# Patient Record
Sex: Female | Born: 1975 | Hispanic: Yes | Marital: Married | Smoking: Never smoker
Health system: Southern US, Community
[De-identification: ages and names within clinical notes are randomized; demographics above are authoritative.]

## PROBLEM LIST (undated history)

## (undated) HISTORY — PX: ABDOMINAL HYSTERECTOMY: SHX81

---

## 2004-12-18 ENCOUNTER — Ambulatory Visit: Payer: Self-pay | Admitting: Family Medicine

## 2006-05-29 ENCOUNTER — Encounter: Payer: Self-pay | Admitting: Family Medicine

## 2006-06-10 ENCOUNTER — Encounter: Payer: Self-pay | Admitting: Family Medicine

## 2007-05-16 ENCOUNTER — Ambulatory Visit: Payer: Self-pay | Admitting: Family Medicine

## 2007-05-28 ENCOUNTER — Ambulatory Visit: Payer: Self-pay | Admitting: Orthopaedic Surgery

## 2008-08-18 ENCOUNTER — Ambulatory Visit: Payer: Self-pay | Admitting: Family Medicine

## 2008-09-17 ENCOUNTER — Ambulatory Visit: Payer: Self-pay | Admitting: Family Medicine

## 2008-12-30 ENCOUNTER — Ambulatory Visit: Payer: Self-pay | Admitting: Family Medicine

## 2009-05-26 ENCOUNTER — Ambulatory Visit: Payer: Self-pay | Admitting: Family Medicine

## 2009-11-08 ENCOUNTER — Ambulatory Visit: Payer: Self-pay | Admitting: Family Medicine

## 2010-12-01 IMAGING — US US PELV - US TRANSVAGINAL
1 series · 14 of 25 positions shown · non-contrast
Comparison: none

REASON FOR EXAM: Pelvic pain
COMMENTS:

[Series 1: us pelv - us transvaginal · 0.43mm/px · 14 of 66 slices shown]
[im 1/66]
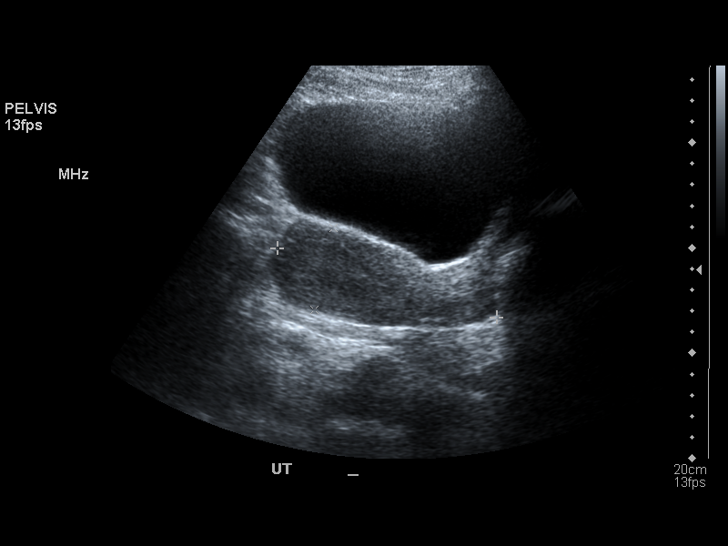
[im 6/66]
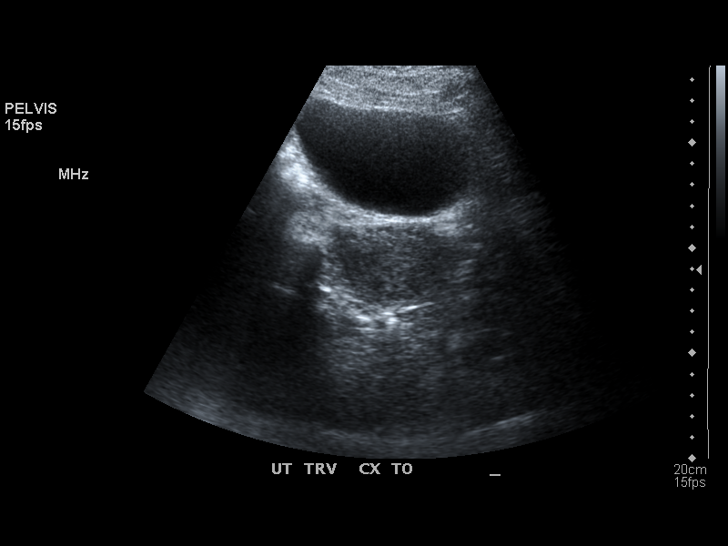
[im 11/66]
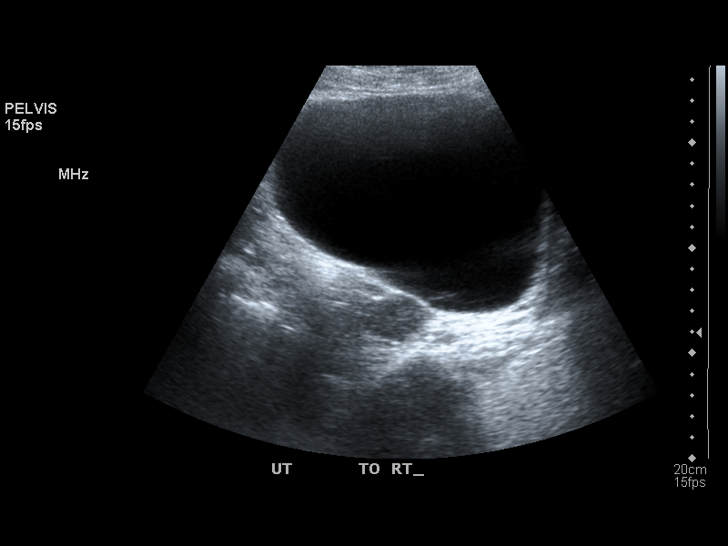
[im 17/66]
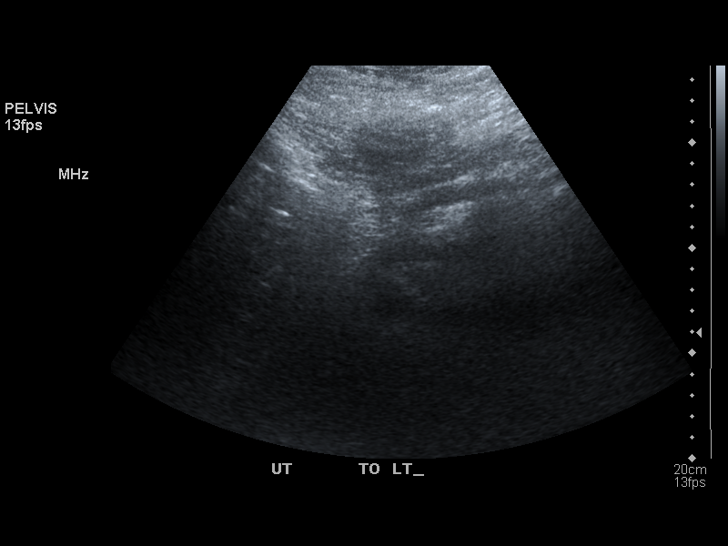
[im 22/66]
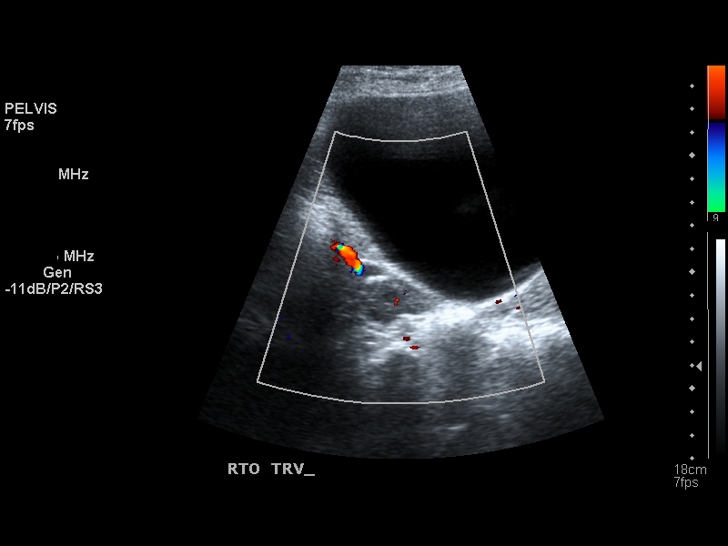
[im 25/66]
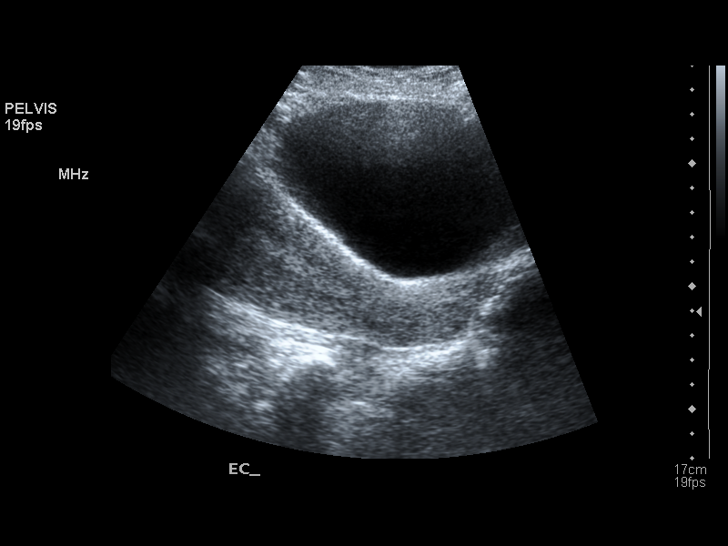
[im 30/66]
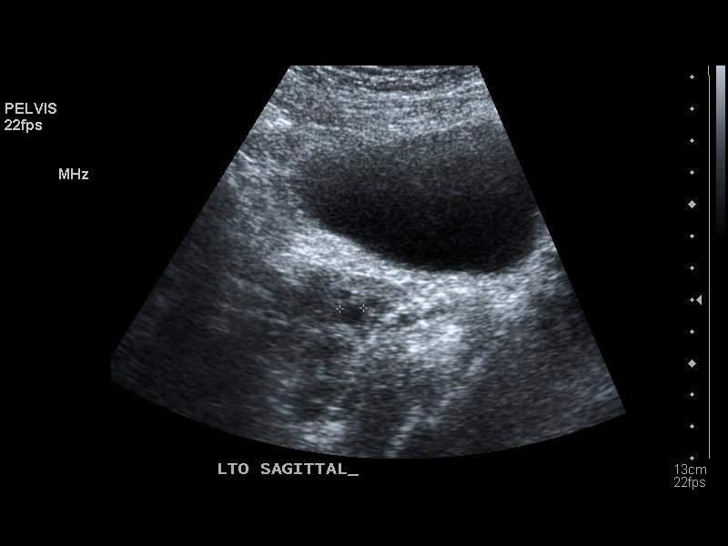
[im 36/66]
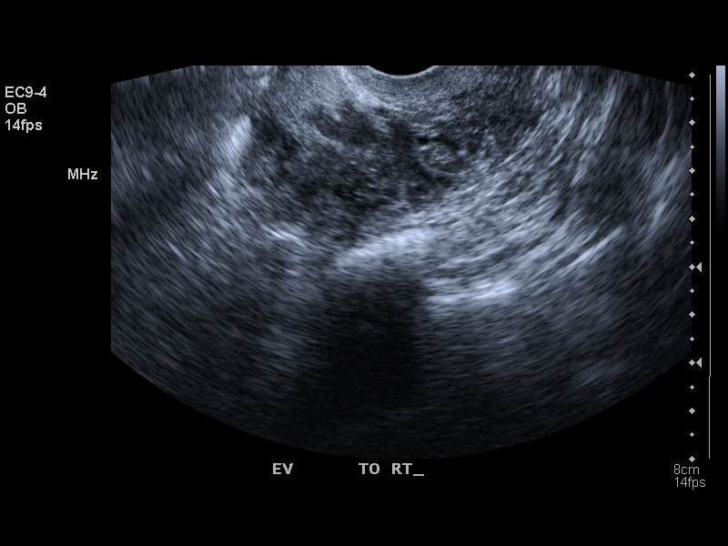
[im 41/66]
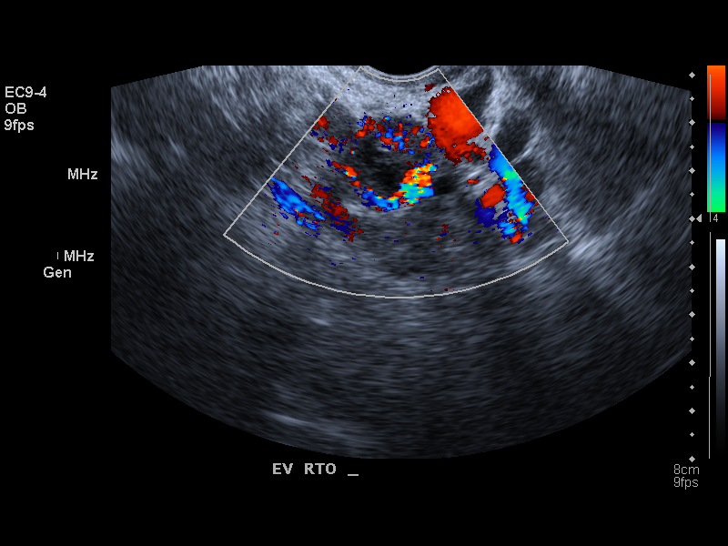
[im 44/66]
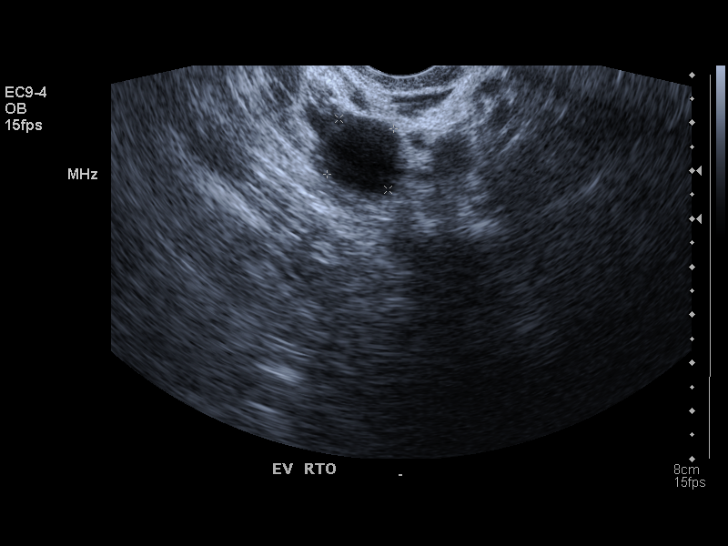
[im 49/66]
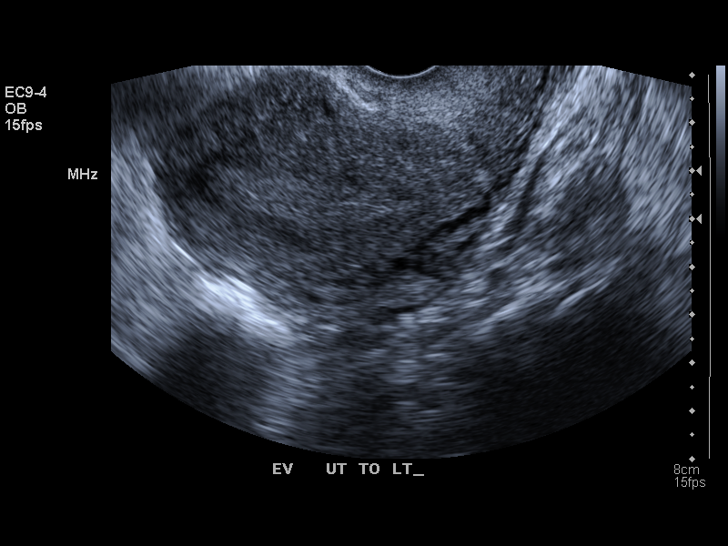
[im 55/66]
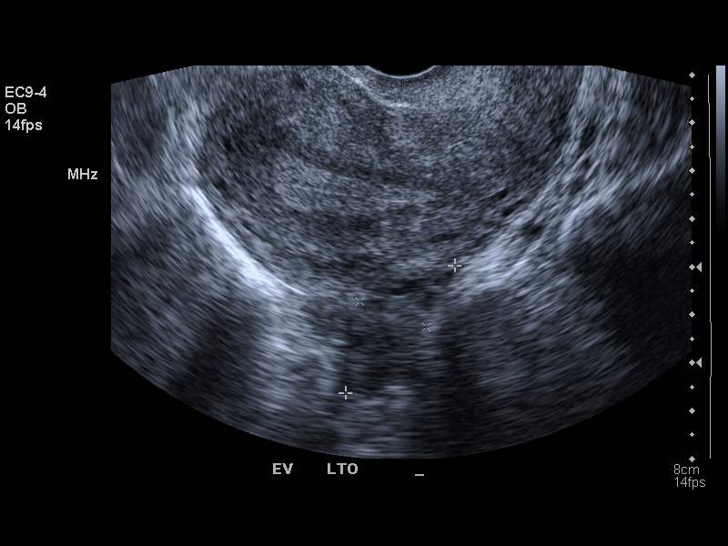
[im 60/66]
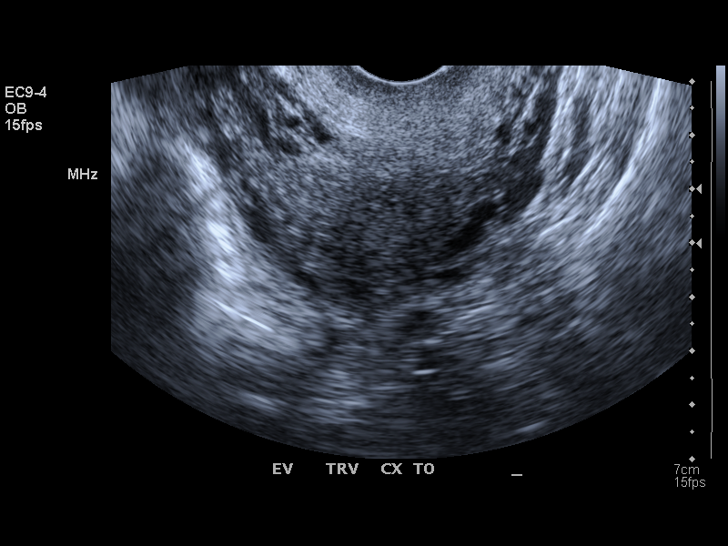
[im 66/66]
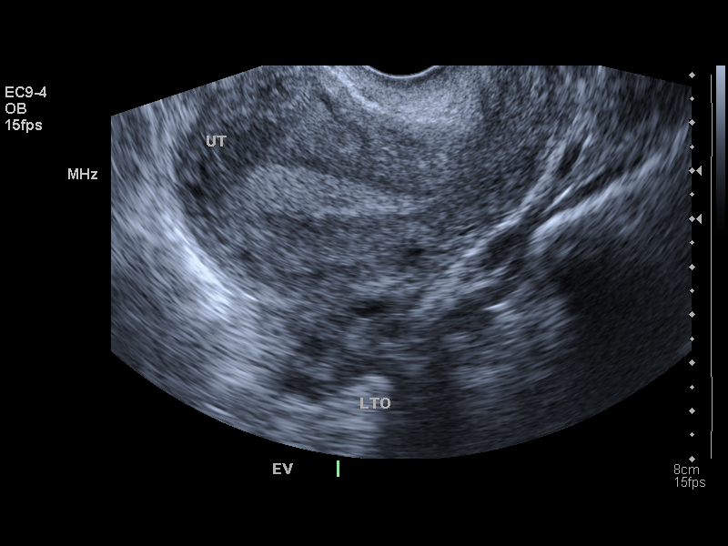

[14 of 25 positions shown; findings below may reference images not displayed]

PROCEDURE:     US  - US PELVIS MASS EXAM W/TRANSVAGI  - August 18, 2008  [DATE]

RESULT:     Transabdominal and endovaginal ultrasound was performed.

The uterus measures 10.9 cm x 4.0 cm x 6.1 cm. The endometrium measures
mm in thickness. No uterine mass lesions are seen. The RIGHT and LEFT
ovaries are visualized. The RIGHT ovary measures 4.09 cm at maximum
diameter. The LEFT ovary measures 3.49 cm at maximum diameter. There is a
1.68 cm cyst of the RIGHT ovary. No free fluid is seen in the pelvis.
IMPRESSION: There is a 1.68 cm simple cyst of the RIGHT ovary.
Otherwise, normal study.

## 2010-12-31 IMAGING — US TRANSABDOMINAL ULTRASOUND OF PELVIS
1 series · 17 of 25 positions shown · non-contrast
Comparison: none

REASON FOR EXAM: Follow-up small ovarian cyst
COMMENTS:

[Series 1: transabdominal ultrasound of pelvis · 17 of 26 slices shown]
[im 1/26]
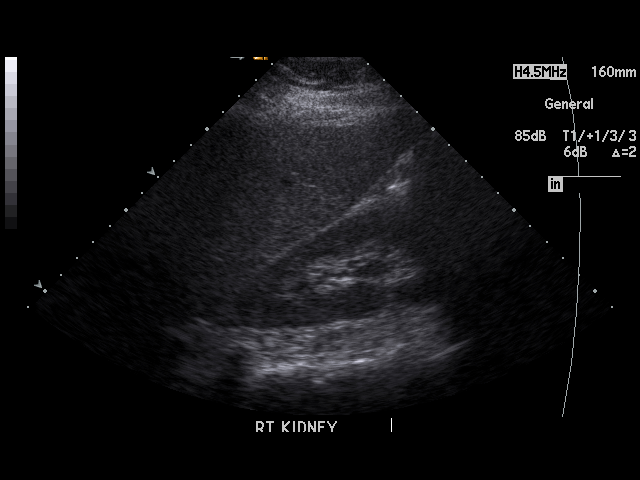
[im 3/26]
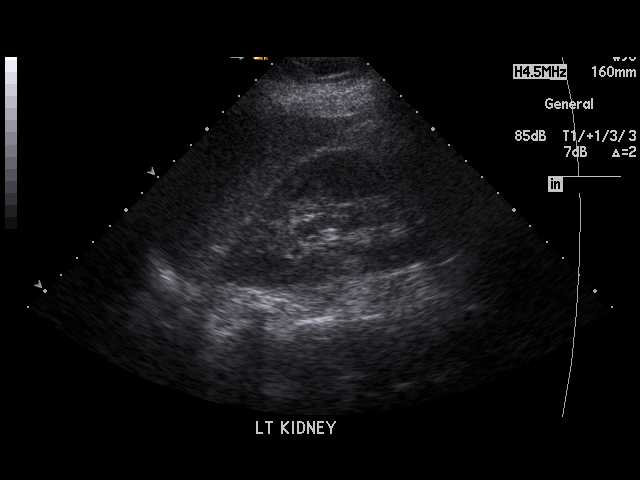
[im 4/26]
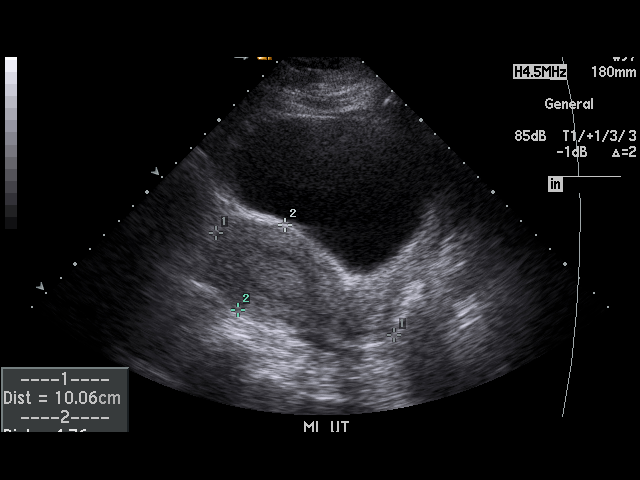
[im 6/26]
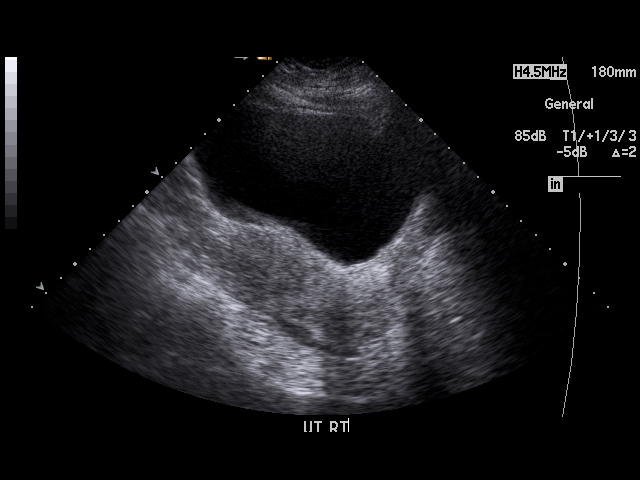
[im 7/26]
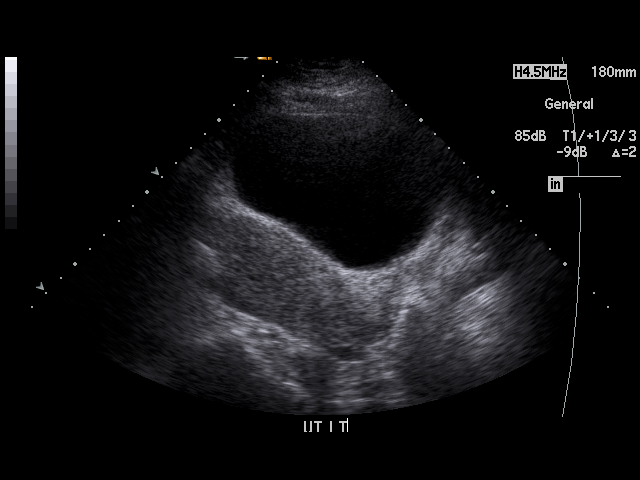
[im 9/26]
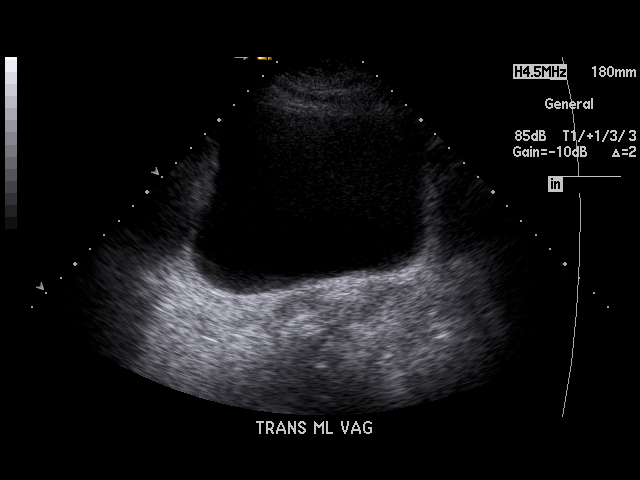
[im 10/26]
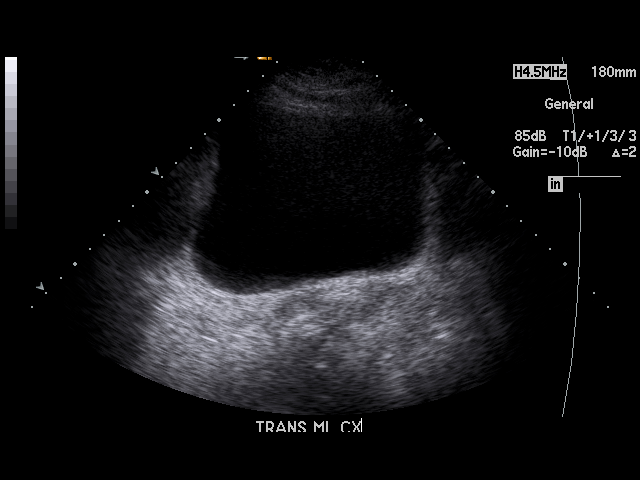
[im 12/26]
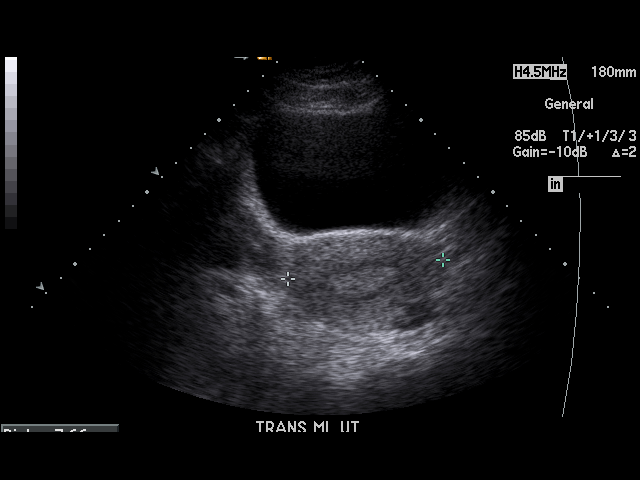
[im 13/26]
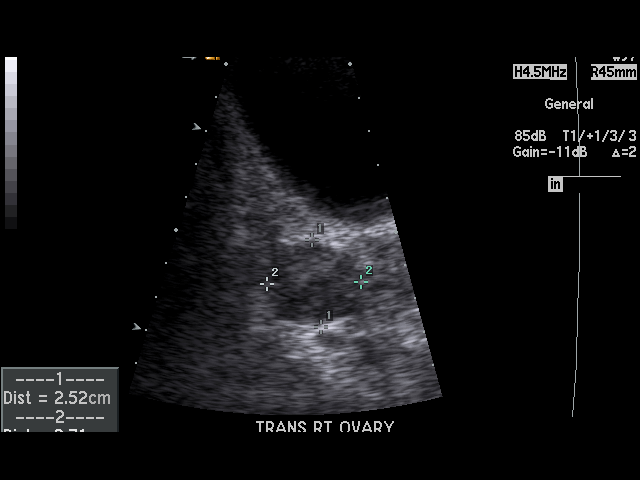
[im 14/26]
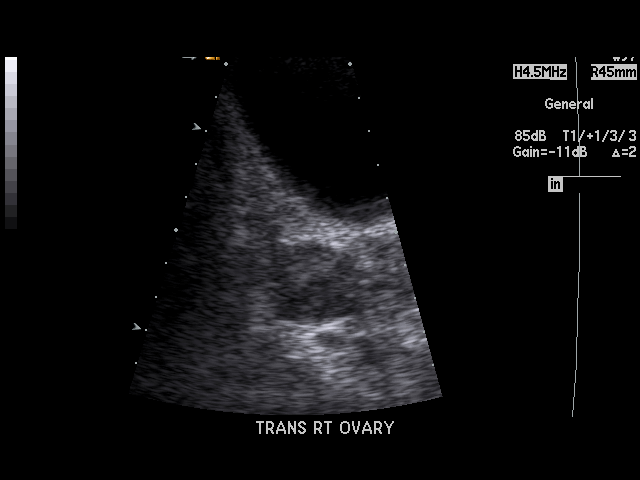
[im 16/26]
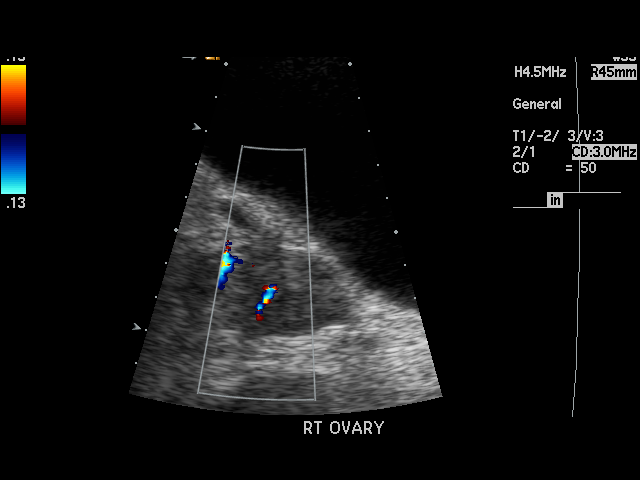
[im 17/26]
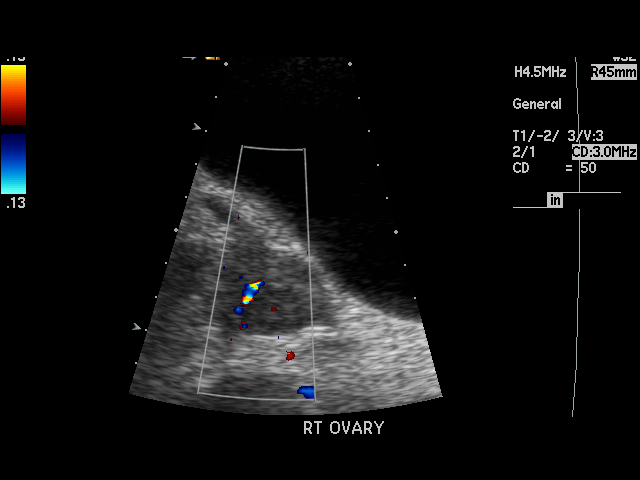
[im 19/26]
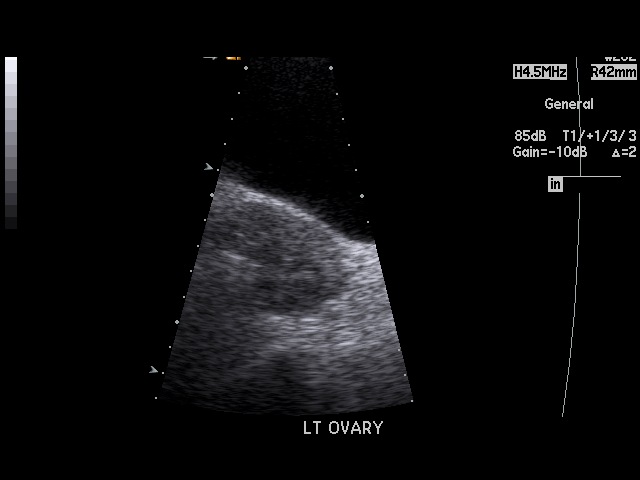
[im 20/26]
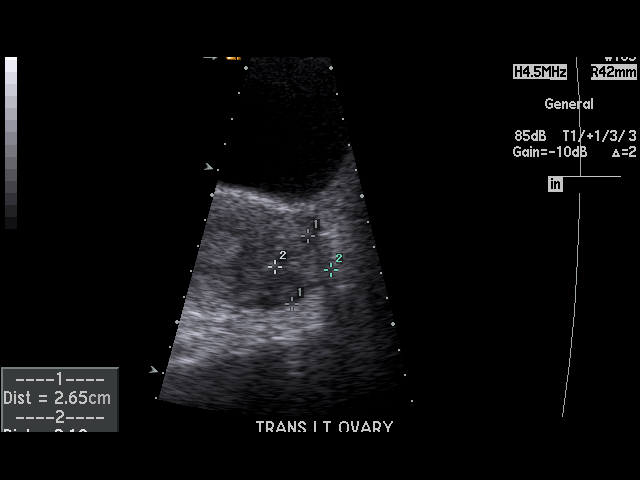
[im 22/26]
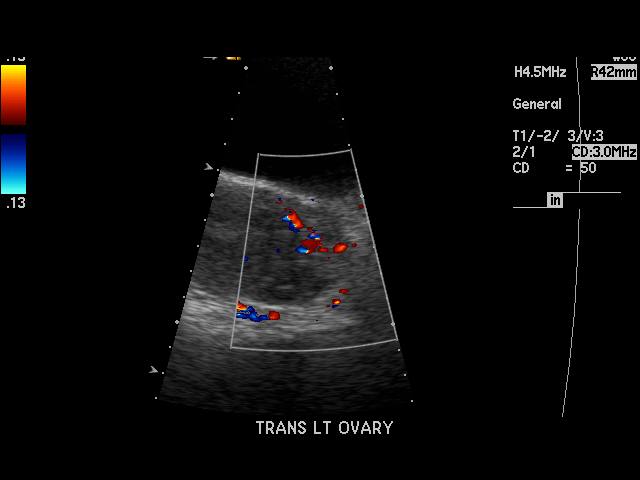
[im 23/26]
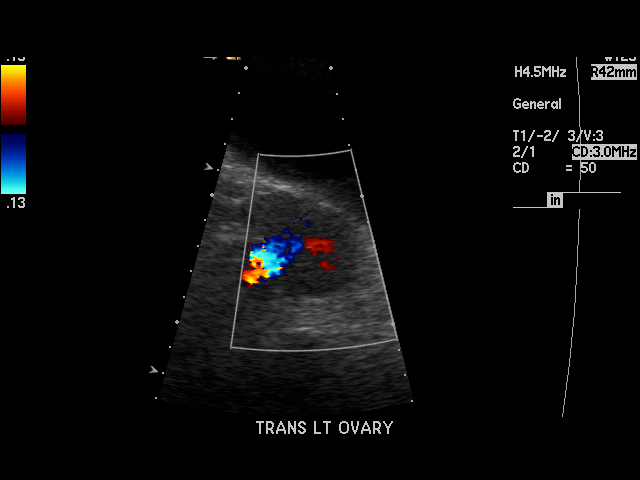
[im 26/26]
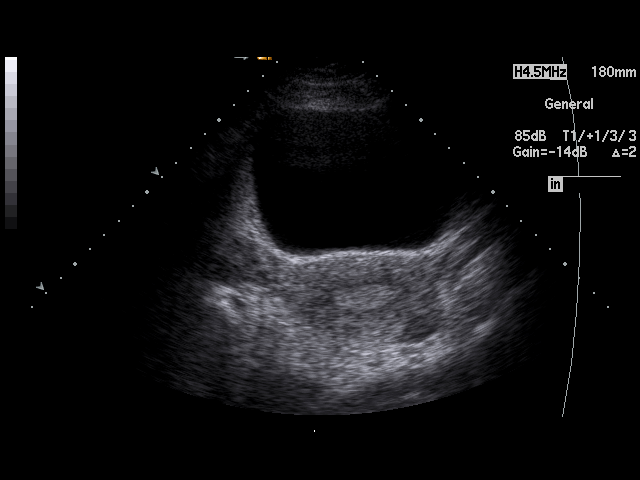

[17 of 25 positions shown; findings below may reference images not displayed]

PROCEDURE:     US  - US PELVIS MASS EXAM  - [DATE]  [DATE] [DATE]  [DATE]

RESULT:     The uterus exhibits normal echotexture and measures
approximately 10.1 x 4.8 x 7.7 cm. The endometrial stripe is thickened at
approximately 13 to 14 mm. There is a small amount of free fluid in the
cul-de-sac. The right ovary measures 2.5 x 2.7 x 2.9 cm. The left ovary
measures 3.1 x 2.7 x 2.1 cm.
IMPRESSION: 1.  There is mild thickening of the endometrial stripe. The patient is due
for her menstrual period within the next several days.
2.  I do not see evidence of adnexal masses. The cyst seen in [REDACTED]
involving the right ovary is no longer demonstrated.
3.  There is a small amount of free fluid in the cul-de-sac.

## 2011-01-18 ENCOUNTER — Emergency Department: Payer: Self-pay | Admitting: Emergency Medicine

## 2019-01-03 ENCOUNTER — Emergency Department
Admission: EM | Admit: 2019-01-03 | Discharge: 2019-01-03 | Disposition: A | Discharge status: Home / Self Care | Payer: HRSA Program | Attending: Emergency Medicine | Admitting: Emergency Medicine

## 2019-01-03 ENCOUNTER — Other Ambulatory Visit: Payer: Self-pay

## 2019-01-03 ENCOUNTER — Encounter: Payer: Self-pay | Admitting: Emergency Medicine

## 2019-01-03 DIAGNOSIS — B349 Viral infection, unspecified: Secondary | ICD-10-CM

## 2019-01-03 DIAGNOSIS — R509 Fever, unspecified: Secondary | ICD-10-CM | POA: Diagnosis present

## 2019-01-03 MED ORDER — HYDROCOD POLST-CPM POLST ER 10-8 MG/5ML PO SUER: 5.0000 mL | Freq: Two times a day (BID) | ORAL | 0 refills | Status: AC

## 2019-01-03 MED ORDER — IBUPROFEN 800 MG PO TABS: 800.0000 mg | ORAL_TABLET | Freq: Three times a day (TID) | ORAL | 0 refills | Status: AC | PRN

## 2019-01-03 NOTE — ED Triage Notes (Addendum)
Patient ambulatory to CPOD with complaints of generalized body aches and cough (induced HA following) for 2 days.  Pt reports HA/fever at home.  Pt reports taking ASA at 1400 today before coming to ED.  Speaking in complete coherent sentences. No acute breathing distress noted.  Pt reports husband dx with CORONA but has been sequestered.  Pt reports work called her and reported exposure to [employer]

## 2019-01-03 NOTE — ED Notes (Signed)
No peripheral IV placed this visit.   Discharge instructions reviewed with patient. Questions fielded by this RN. Patient verbalizes understanding of instructions. Patient discharged home in stable condition per williams. No acute distress noted at time of discharge.   

## 2019-01-03 NOTE — ED Provider Notes (Signed)
[facility] Emergency Department Provider Note       Time seen: ----------------------------------------- 8:01 PM on 01/03/2019 -----------------------------------------   I have reviewed the triage vital signs and the nursing notes.  HISTORY   Chief Complaint No chief complaint on file.    HPI Robin Pugh is a 43 y.o. female with no significant past medical history who presents to the ED for body aches, cough and fever for the past 2 days.  She has had headache and fever at home.  Reports taking aspirin earlier before coming to the ER.  Her husband has been diagnosed with coronavirus but is at home with sequestered.  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  No past medical history on file.  There are no active problems to display for this patient.   Allergies Patient has no allergy information on record.  Social History Social History   Tobacco Use  . Smoking status: Not on file  Substance Use Topics  . Alcohol use: Not on file  . Drug use: Not on file    Review of Systems Constitutional: Positive for fever, chills Cardiovascular: Negative for chest pain. Respiratory: Negative for shortness of breath.  Positive for cough Gastrointestinal: Negative for abdominal pain, vomiting and diarrhea. Musculoskeletal: Positive for body aches Skin: Negative for rash. Neurological: Negative for headaches, focal weakness or numbness.  All systems negative/normal/unremarkable except as stated in the HPI  ____________________________________________   PHYSICAL EXAM:  VITAL SIGNS: ED Triage Vitals  Enc Vitals Group     BP      Pulse      Resp      Temp      Temp src      SpO2      Weight      Height      Head Circumference      Peak Flow      Pain Score      Pain Loc      Pain Edu?      Excl. in GC?    Constitutional: Alert and oriented. Well appearing and in no distress. Eyes: Conjunctivae are normal. Normal  extraocular movements. Cardiovascular: Normal rate, regular rhythm. No murmurs, rubs, or gallops. Respiratory: Normal respiratory effort without tachypnea nor retractions. Breath sounds are clear and equal bilaterally. No wheezes/rales/rhonchi. Gastrointestinal: Soft and nontender. Normal bowel sounds Musculoskeletal: Nontender with normal range of motion in extremities. No lower extremity tenderness nor edema. Neurologic:  Normal speech and language. No gross focal neurologic deficits are appreciated.  Skin:  Skin is warm, dry and intact. No rash noted. Psychiatric: Mood and affect are normal. Speech and behavior are normal.   ____________________________________________  ED COURSE:  As part of my medical decision making, I reviewed the following data within the electronic MEDICAL RECORD NUMBER History obtained from family if available, nursing notes, old chart and ekg, as well as notes from prior ED visits. Patient presented for flulike symptoms, we will assess with labs as indicated at this time.   Procedures  Robin Pugh was evaluated in Emergency Department on 01/03/2019 for the symptoms described in the history of present illness. She was evaluated in the context of the global COVID-19 pandemic, which necessitated consideration that the patient might be at risk for infection with the SARS-CoV-2 virus that causes COVID-19. Institutional protocols and algorithms that pertain to the evaluation of patients at risk for COVID-19 are in a state of rapid change based on information released by regulatory  bodies including the CDC and federal and state organizations. These policies and algorithms were followed during the patient's care in the ED.  ____________________________________________   LABS (pertinent positives/negatives)  Labs Reviewed  NOVEL CORONAVIRUS, NAA (HOSPITAL ORDER, SEND-OUT TO REF LAB)  ____________________________________________   DIFFERENTIAL DIAGNOSIS   Coronavirus,  viral syndrome  FINAL ASSESSMENT AND PLAN  Viral syndrome   Plan: The patient had presented for flulike symptoms and likely has coronavirus. Patient's labs are still pending at this time.  Will be discharged home with anti-inflammatory and cough medicine.  I will also write for a work note for the next week.   Ulice DashJohnathan Pugh Britta Louth, MD    Note: This note was generated in part or whole with voice recognition software. Voice recognition is usually quite accurate but there are transcription errors that can and very often do occur. I apologize for any typographical errors that were not detected and corrected.     Robin Pugh, Robin Mayson E, MD 01/03/19 2011

## 2019-01-05 LAB — NOVEL CORONAVIRUS, NAA (HOSP ORDER, SEND-OUT TO REF LAB; TAT 18-24 HRS): SARS-CoV-2, NAA: DETECTED — AB

## 2019-01-06 ENCOUNTER — Telehealth: Payer: Self-pay | Admitting: Emergency Medicine

## 2019-01-06 NOTE — Telephone Encounter (Signed)
Called patient to inform of positive covid 19 test.  [facility] interpretter on line.  Patient did not answer.  Will try again tomorrow.

## 2019-01-07 ENCOUNTER — Telehealth: Payer: Self-pay | Admitting: Emergency Medicine

## 2019-01-07 NOTE — Telephone Encounter (Addendum)
Called patient again to assure she is aware of positive covid 19 test and cdc guidelines.  No answer again   Called the emergency contact luis and that was wrong number.   interpretter found another number, called them and they gave her pt number which is [phone].  We called her and gave her results and explained cdc guidelines.

## 2019-02-06 ENCOUNTER — Telehealth: Payer: Self-pay | Admitting: *Deleted

## 2019-02-06 NOTE — Telephone Encounter (Signed)
I called pt to request donation of plasma to help in the recover of other COVID-19 pts since she is recovered from the virus.    Unable to leave a message because the voicemail box has not been set up.  Unable to contact her or leave a message.  (I spoke with her and her husband yesterday, Robin Pugh, when I contacted him about donating.  She mentioned that she had the virus too.   They were given the [email or web address] web site yesterday.   So she is aware of how to donate even though I did not talk with her today).

## 2020-09-06 ENCOUNTER — Ambulatory Visit
Admission: EM | Admit: 2020-09-06 | Discharge: 2020-09-06 | Disposition: A | Discharge status: Home / Self Care | Payer: HRSA Program | Attending: Emergency Medicine | Admitting: Emergency Medicine

## 2020-09-06 ENCOUNTER — Other Ambulatory Visit: Payer: Self-pay

## 2020-09-06 DIAGNOSIS — R0981 Nasal congestion: Secondary | ICD-10-CM

## 2020-09-06 DIAGNOSIS — B349 Viral infection, unspecified: Secondary | ICD-10-CM | POA: Insufficient documentation

## 2020-09-06 DIAGNOSIS — Z20822 Contact with and (suspected) exposure to covid-19: Secondary | ICD-10-CM | POA: Insufficient documentation

## 2020-09-06 DIAGNOSIS — R059 Cough, unspecified: Secondary | ICD-10-CM | POA: Insufficient documentation

## 2020-09-06 DIAGNOSIS — U071 COVID-19: Secondary | ICD-10-CM | POA: Diagnosis not present

## 2020-09-06 LAB — RESP PANEL BY RT-PCR (FLU A&B, COVID) ARPGX2
Influenza A by PCR: NEGATIVE
Influenza B by PCR: NEGATIVE
SARS Coronavirus 2 by RT PCR: POSITIVE — AB

## 2020-09-06 NOTE — Discharge Instructions (Addendum)

## 2020-09-06 NOTE — ED Triage Notes (Signed)
Pt is here with a headache, body aches and a cough that started 3 days ago, pt states her daughter recently tested POSITIVE for COVID & the last contact she had with her was Thursday. Pt has taking Tylenol to relieve discomfort.

## 2020-09-06 NOTE — ED Provider Notes (Signed)
[facility]    CSN: [id number] Arrival date & time: 09/06/20  1144      History   Chief Complaint Chief Complaint  Patient presents with  . Generalized Body Aches  . Cough    HPI Robin Pugh is a 44 y.o. female presenting for onset of body aches, headaches, nasal congestion, and cough 3 days ago.  Patient states that her daughter test positive for Covid yesterday.  Patient denies any known fevers.  Denies any chills, sweats, sore throat, chest pain, breathing difficulty, N/V/D, or changes in smell or taste.  Patient's been taking over-the-counter Tylenol to help with symptoms.   Patient has personal history of COVID-19 in April 2020.  She has not been vaccinated for COVID-19.  No significant past medical history.  No other complaints or concerns today.  HPI  History reviewed. No pertinent past medical history.  There are no problems to display for this patient.   Past Surgical History:  Procedure Laterality Date  . ABDOMINAL HYSTERECTOMY      OB History    Gravida  3   Para  3   Term      Preterm      AB      Living  3     SAB      IAB      Ectopic      Multiple      Live Births               Home Medications    Prior to Admission medications   Medication Sig Start Date End Date Taking? Authorizing Provider  chlorpheniramine-HYDROcodone (TUSSIONEX PENNKINETIC ER) 10-8 MG/5ML SUER Take 5 mLs by mouth 2 (two) times daily. 01/03/19   Emily Filbert, MD  ibuprofen (ADVIL) 800 MG tablet Take 1 tablet (800 mg total) by mouth every 8 (eight) hours as needed. 01/03/19   Emily Filbert, MD    Family History Family History  Problem Relation Age of Onset  . Diabetes Mother   . Lung cancer Father     Social History Social History   Tobacco Use  . Smoking status: Never Smoker  . Smokeless tobacco: Never Used  Substance Use Topics  . Alcohol use: Not Currently  . Drug use: Never     Allergies   Patient has no known  allergies.   Review of Systems Review of Systems  Constitutional: Positive for fatigue. Negative for chills, diaphoresis and fever.  HENT: Positive for congestion and rhinorrhea. Negative for ear pain, sinus pressure, sinus pain and sore throat.   Respiratory: Positive for cough. Negative for shortness of breath.   Cardiovascular: Negative for chest pain.  Gastrointestinal: Negative for abdominal pain, nausea and vomiting.  Musculoskeletal: Positive for myalgias. Negative for arthralgias.  Skin: Negative for rash.  Neurological: Positive for headaches. Negative for weakness.  Hematological: Negative for adenopathy.     Physical Exam Triage Vital Signs ED Triage Vitals  Enc Vitals Group     BP 09/06/20 1420 121/75     Pulse Rate 09/06/20 1420 87     Resp 09/06/20 1420 16     Temp 09/06/20 1420 98 F (36.7 C)     Temp Source 09/06/20 1420 Oral     SpO2 09/06/20 1420 96 %     Weight --      Height --      Head Circumference --      Peak Flow --      Pain  Score 09/06/20 1418 0     Pain Loc --      Pain Edu? --      Excl. in GC? --    No data found.  Updated Vital Signs BP 121/75 (BP Location: Left Arm)   Pulse 87   Temp 98 F (36.7 C) (Oral)   Resp 16   SpO2 96%       Physical Exam Vitals and nursing note reviewed.  Constitutional:      General: She is not in acute distress.    Appearance: Normal appearance. She is not ill-appearing or toxic-appearing.  HENT:     Head: Normocephalic and atraumatic.     Nose: Congestion (moderate swelling of nasal mucosa with clear drainage) and rhinorrhea present.     Mouth/Throat:     Mouth: Mucous membranes are moist.     Pharynx: Oropharynx is clear. Posterior oropharyngeal erythema (moderate) present.  Eyes:     General: No scleral icterus.       Right eye: No discharge.        Left eye: No discharge.     Conjunctiva/sclera: Conjunctivae normal.  Cardiovascular:     Rate and Rhythm: Normal rate and regular rhythm.      Heart sounds: Normal heart sounds.  Pulmonary:     Effort: Pulmonary effort is normal. No respiratory distress.     Breath sounds: Normal breath sounds.  Musculoskeletal:     Cervical back: Neck supple.  Skin:    General: Skin is dry.  Neurological:     General: No focal deficit present.     Mental Status: She is alert. Mental status is at baseline.     Motor: No weakness.     Gait: Gait normal.  Psychiatric:        Mood and Affect: Mood normal.        Behavior: Behavior normal.        Thought Content: Thought content normal.      UC Treatments / Results  Labs (all labs ordered are listed, but only abnormal results are displayed) Labs Reviewed  RESP PANEL BY RT-PCR (FLU A&B, COVID) ARPGX2 - Abnormal; Notable for the following components:      Result Value   SARS Coronavirus 2 by RT PCR POSITIVE (*)    All other components within normal limits    EKG   Radiology No results found.  Procedures Procedures (including critical care time)  Medications Ordered in UC Medications - No data to display  Initial Impression / Assessment and Plan / UC Course  I have reviewed the triage vital signs and the nursing notes.  Pertinent labs & imaging results that were available during my care of the patient were reviewed by me and considered in my medical decision making (see chart for details).   44 year old female with positive COVID-19 exposure presenting for cough, congestion and sore throat x3 days.  All vital signs are normal stable in clinic and she is in no acute distress.  Chest is clear to auscultation heart regular rate and rhythm.  Patient appears well.  Respiratory panel obtained and positive for COVID-19.  Call to discuss results with patient.  CDC balance, isolation protocol, and ED precautions reviewed with patient.  Patient is unvaccinated but does not appear to have any risk factors.  No history of cardiopulmonary disease, diabetes, autoimmune disease or cancer.   Advised patient I can refer her to the infusion center, but she may be on the list for  a little while since there are some people who are sicker than her.  Advised supportive care with increasing rest and fluids.  Advised continuing over-the-counter medications.  ED precautions again reviewed with patient.   Final Clinical Impressions(s) / UC Diagnoses   Final diagnoses:  Viral illness  Cough  Nasal congestion  Exposure to COVID-19 virus  COVID-19     Discharge Instructions     URI/COLD SYMPTOMS: Your exam today is consistent with a viral illness. Antibiotics are not indicated at this time. Use medications as directed, including cough syrup, nasal saline, and decongestants. Your symptoms should improve over the next few days and resolve within 7-10 days. Increase rest and fluids. F/u if symptoms worsen or predominate such as sore throat, ear pain, productive cough, shortness of breath, or if you develop high fevers or worsening fatigue over the next several days.    You have received COVID testing today either for positive exposure, concerning symptoms that could be related to COVID infection, screening purposes, or re-testing after confirmed positive.  Your test obtained today checks for active viral infection in the last 1-2 weeks. If your test is negative now, you can still test positive later. So, if you do develop symptoms you should either get re-tested and/or isolate x 10 days. Please follow CDC guidelines.  While Rapid antigen tests come back in 15-20 minutes, send out PCR/molecular test results typically come back within 24 hours. In the mean time, if you are symptomatic, assume this could be a positive test and treat/monitor yourself as if you do have COVID.   We will call with test results. Please download the MyChart app and set up a profile to access test results.   If symptomatic, go home and rest. Push fluids. Take Tylenol as needed for discomfort. Gargle warm salt water.  Throat lozenges. Take Mucinex DM or Robitussin for cough. Humidifier in bedroom to ease coughing. Warm showers. Also review the COVID handout for more information.  COVID-19 INFECTION: The incubation period of COVID-19 is approximately 14 days after exposure, with most symptoms developing in roughly 4-5 days. Symptoms may range in severity from mild to critically severe. Roughly 80% of those infected will have mild symptoms. People of any age may become infected with COVID-19 and have the ability to transmit the virus. The most common symptoms include: fever, fatigue, cough, body aches, headaches, sore throat, nasal congestion, shortness of breath, nausea, vomiting, diarrhea, changes in smell and/or taste.    COURSE OF ILLNESS Some patients may begin with mild disease which can progress quickly into critical symptoms. If your symptoms are worsening please call ahead to the Emergency Department and proceed there for further treatment. Recovery time appears to be roughly 1-2 weeks for mild symptoms and 3-6 weeks for severe disease.   GO IMMEDIATELY TO ER FOR FEVER YOU ARE UNABLE TO GET DOWN WITH TYLENOL, BREATHING PROBLEMS, CHEST PAIN, FATIGUE, LETHARGY, INABILITY TO EAT OR DRINK, ETC  QUARANTINE AND ISOLATION: To help decrease the spread of COVID-19 please remain isolated if you have COVID infection or are highly suspected to have COVID infection. This means -stay home and isolate to one room in the home if you live with others. Do not share a bed or bathroom with others while ill, sanitize and wipe down all countertops and keep common areas clean and disinfected. You may discontinue isolation if you have a mild case and are asymptomatic 10 days after symptom onset as long as you have been fever free >24  hours without having to take Motrin or Tylenol. If your case is more severe (meaning you develop pneumonia or are admitted in the hospital), you may have to isolate longer.   If you have been in close  contact (within 6 feet) of someone diagnosed with COVID 19, you are advised to quarantine in your home for 14 days as symptoms can develop anywhere from 2-14 days after exposure to the virus. If you develop symptoms, you  must isolate.  Most current guidelines for COVID after exposure -isolate 10 days if you ARE NOT tested for COVID as long as symptoms do not develop -isolate 7 days if you are tested and remain asymptomatic -You do not necessarily need to be tested for COVID if you have + exposure and        develop   symptoms. Just isolate at home x10 days from symptom onset During this global pandemic, CDC advises to practice social distancing, try to stay at least 34ft away from others at all times. Wear a face covering. Wash and sanitize your hands regularly and avoid going anywhere that is not necessary.  KEEP IN MIND THAT THE COVID TEST IS NOT 100% ACCURATE AND YOU SHOULD STILL DO EVERYTHING TO PREVENT POTENTIAL SPREAD OF VIRUS TO OTHERS (WEAR MASK, WEAR GLOVES, WASH HANDS AND SANITIZE REGULARLY). IF INITIAL TEST IS NEGATIVE, THIS MAY NOT MEAN YOU ARE DEFINITELY NEGATIVE. MOST ACCURATE TESTING IS DONE 5-7 DAYS AFTER EXPOSURE.   It is not advised by CDC to get re-tested after receiving a positive COVID test since you can still test positive for weeks to months after you have already cleared the virus.   *If you have not been vaccinated for COVID, I strongly suggest you consider getting vaccinated as long as there are no contraindications.      ED Prescriptions    None     PDMP not reviewed this encounter.   Shirlee Latch, PA-C 09/06/20 1528

## 2023-12-02 ENCOUNTER — Other Ambulatory Visit: Payer: Self-pay | Admitting: Nurse Practitioner

## 2023-12-02 DIAGNOSIS — Z1231 Encounter for screening mammogram for malignant neoplasm of breast: Secondary | ICD-10-CM

## 2024-01-03 ENCOUNTER — Telehealth: Payer: Self-pay | Admitting: *Deleted
